# Patient Record
Sex: Male | Born: 1980 | Race: White | Hispanic: No | Marital: Single | Smoking: Never smoker
Health system: Southern US, Community
[De-identification: ages and names within clinical notes are randomized; demographics above are authoritative.]

---

## 2002-12-23 ENCOUNTER — Emergency Department (HOSPITAL_COMMUNITY): Admission: EM | Admit: 2002-12-23 | Discharge: 2002-12-23 | Payer: Self-pay | Admitting: Emergency Medicine

## 2014-02-17 ENCOUNTER — Emergency Department (HOSPITAL_COMMUNITY): Payer: Self-pay

## 2014-02-17 ENCOUNTER — Emergency Department (HOSPITAL_COMMUNITY)
Admission: EM | Admit: 2014-02-17 | Discharge: 2014-02-17 | Disposition: A | Discharge status: Home / Self Care | Payer: Self-pay | Attending: Emergency Medicine | Admitting: Emergency Medicine

## 2014-02-17 ENCOUNTER — Encounter (HOSPITAL_COMMUNITY): Payer: Self-pay | Admitting: Emergency Medicine

## 2014-02-17 DIAGNOSIS — IMO0001 Reserved for inherently not codable concepts without codable children: Secondary | ICD-10-CM | POA: Insufficient documentation

## 2014-02-17 DIAGNOSIS — J159 Unspecified bacterial pneumonia: Secondary | ICD-10-CM | POA: Insufficient documentation

## 2014-02-17 DIAGNOSIS — R51 Headache: Secondary | ICD-10-CM | POA: Insufficient documentation

## 2014-02-17 DIAGNOSIS — J189 Pneumonia, unspecified organism: Secondary | ICD-10-CM

## 2014-02-17 DIAGNOSIS — R509 Fever, unspecified: Secondary | ICD-10-CM | POA: Insufficient documentation

## 2014-02-17 LAB — URINALYSIS, ROUTINE W REFLEX MICROSCOPIC
BILIRUBIN URINE: NEGATIVE
Glucose, UA: NEGATIVE mg/dL
Hgb urine dipstick: NEGATIVE
KETONES UR: NEGATIVE mg/dL
LEUKOCYTES UA: NEGATIVE
Nitrite: NEGATIVE
PH: 7 (ref 5.0–8.0)
Protein, ur: 30 mg/dL — AB
Specific Gravity, Urine: 1.027 (ref 1.005–1.030)
Urobilinogen, UA: 1 mg/dL (ref 0.0–1.0)

## 2014-02-17 LAB — CBC WITH DIFFERENTIAL/PLATELET
Basophils Absolute: 0 10*3/uL (ref 0.0–0.1)
Basophils Relative: 0 % (ref 0–1)
EOS ABS: 0 10*3/uL (ref 0.0–0.7)
Eosinophils Relative: 1 % (ref 0–5)
HCT: 43.7 % (ref 39.0–52.0)
Hemoglobin: 15.2 g/dL (ref 13.0–17.0)
LYMPHS ABS: 0.8 10*3/uL (ref 0.7–4.0)
Lymphocytes Relative: 17 % (ref 12–46)
MCH: 31 pg (ref 26.0–34.0)
MCHC: 34.8 g/dL (ref 30.0–36.0)
MCV: 89.2 fL (ref 78.0–100.0)
MONO ABS: 0.4 10*3/uL (ref 0.1–1.0)
MONOS PCT: 7 % (ref 3–12)
Neutro Abs: 3.8 10*3/uL (ref 1.7–7.7)
Neutrophils Relative %: 75 % (ref 43–77)
Platelets: 180 10*3/uL (ref 150–400)
RBC: 4.9 MIL/uL (ref 4.22–5.81)
RDW: 12.4 % (ref 11.5–15.5)
WBC: 5.1 10*3/uL (ref 4.0–10.5)

## 2014-02-17 LAB — URINE MICROSCOPIC-ADD ON

## 2014-02-17 LAB — COMPREHENSIVE METABOLIC PANEL
ALBUMIN: 3.8 g/dL (ref 3.5–5.2)
ALT: 13 U/L (ref 0–53)
ANION GAP: 17 — AB (ref 5–15)
AST: 16 U/L (ref 0–37)
Alkaline Phosphatase: 63 U/L (ref 39–117)
BILIRUBIN TOTAL: 0.5 mg/dL (ref 0.3–1.2)
BUN: 17 mg/dL (ref 6–23)
CHLORIDE: 96 meq/L (ref 96–112)
CO2: 23 mEq/L (ref 19–32)
CREATININE: 1.15 mg/dL (ref 0.50–1.35)
Calcium: 9.1 mg/dL (ref 8.4–10.5)
GFR calc non Af Amer: 84 mL/min — ABNORMAL LOW (ref >90)
GLUCOSE: 142 mg/dL — AB (ref 70–99)
Potassium: 4 mEq/L (ref 3.7–5.3)
Sodium: 136 mEq/L — ABNORMAL LOW (ref 137–147)
Total Protein: 7.6 g/dL (ref 6.0–8.3)

## 2014-02-17 MED ORDER — ACETAMINOPHEN 325 MG PO TABS
650.0000 mg | ORAL_TABLET | Freq: Once | ORAL | Status: AC
  Administered 2014-02-17: 650 mg via ORAL
  Filled 2014-02-17: qty 2

## 2014-02-17 MED ORDER — AZITHROMYCIN 250 MG PO TABS: 250.0000 mg | ORAL_TABLET | Freq: Every day | ORAL | Status: AC

## 2014-02-17 MED ORDER — BENZONATATE 100 MG PO CAPS: 100.0000 mg | ORAL_CAPSULE | Freq: Three times a day (TID) | ORAL | Status: AC

## 2014-02-17 MED ORDER — AMOXICILLIN 500 MG PO CAPS: 500.0000 mg | ORAL_CAPSULE | Freq: Three times a day (TID) | ORAL | Status: AC

## 2014-02-17 NOTE — Discharge Instructions (Signed)
Your x-ray shows pneumonia. Zithromax and amoxil as prescribed until all gone. Follow up as needed. Return if worsening. Continue ibuprofen and tylenol for fever.   Pneumonia Pneumonia is an infection of the lungs.  CAUSES Pneumonia may be caused by bacteria or a virus. Usually, these infections are caused by breathing infectious particles into the lungs (respiratory tract). SIGNS AND SYMPTOMS   Cough.  Fever.  Chest pain.  Increased rate of breathing.  Wheezing.  Mucus production. DIAGNOSIS  If you have the common symptoms of pneumonia, your health care provider will typically confirm the diagnosis with a chest X-ray. The X-ray will show an abnormality in the lung (pulmonary infiltrate) if you have pneumonia. Other tests of your blood, urine, or sputum may be done to find the specific cause of your pneumonia. Your health care provider may also do tests (blood gases or pulse oximetry) to see how well your lungs are working. TREATMENT  Some forms of pneumonia may be spread to other people when you cough or sneeze. You may be asked to wear a mask before and during your exam. Pneumonia that is caused by bacteria is treated with antibiotic medicine. Pneumonia that is caused by the influenza virus may be treated with an antiviral medicine. Most other viral infections must run their course. These infections will not respond to antibiotics.  HOME CARE INSTRUCTIONS   Cough suppressants may be used if you are losing too much rest. However, coughing protects you by clearing your lungs. You should avoid using cough suppressants if you can.  Your health care provider may have prescribed medicine if he or she thinks your pneumonia is caused by bacteria or influenza. Finish your medicine even if you start to feel better.  Your health care provider may also prescribe an expectorant. This loosens the mucus to be coughed up.  Take medicines only as directed by your health care provider.  Do not  smoke. Smoking is a common cause of bronchitis and can contribute to pneumonia. If you are a smoker and continue to smoke, your cough may last several weeks after your pneumonia has cleared.  A cold steam vaporizer or humidifier in your room or home may help loosen mucus.  Coughing is often worse at night. Sleeping in a semi-upright position in a recliner or using a couple pillows under your head will help with this.  Get rest as you feel it is needed. Your body will usually let you know when you need to rest. PREVENTION A pneumococcal shot (vaccine) is available to prevent a common bacterial cause of pneumonia. This is usually suggested for:  People over 62 years old.  Patients on chemotherapy.  People with chronic lung problems, such as bronchitis or emphysema.  People with immune system problems. If you are over 65 or have a high risk condition, you may receive the pneumococcal vaccine if you have not received it before. In some countries, a routine influenza vaccine is also recommended. This vaccine can help prevent some cases of pneumonia.You may be offered the influenza vaccine as part of your care. If you smoke, it is time to quit. You may receive instructions on how to stop smoking. Your health care provider can provide medicines and counseling to help you quit. SEEK MEDICAL CARE IF: You have a fever. SEEK IMMEDIATE MEDICAL CARE IF:   Your illness becomes worse. This is especially true if you are elderly or weakened from any other disease.  You cannot control your cough with suppressants and  are losing sleep.  You begin coughing up blood.  You develop pain which is getting worse or is uncontrolled with medicines.  Any of the symptoms which initially brought you in for treatment are getting worse rather than better.  You develop shortness of breath or chest pain. MAKE SURE YOU:   Understand these instructions.  Will watch your condition.  Will get help right away if  you are not doing well or get worse. Document Released: 07/09/2005 Document Revised: 11/23/2013 Document Reviewed: 09/28/2010 [facility] Patient Information 2015 [employer] [place]. This information is not intended to replace advice given to you by your health care provider. Make sure you discuss any questions you have with your health care provider.

## 2014-02-17 NOTE — ED Notes (Signed)
Asked pt to provide urine specimen pt stated he could not provide one at this time.  

## 2014-02-17 NOTE — ED Provider Notes (Signed)
CSN: [id number]     Arrival date & time 02/17/14  2022 History   First MD Initiated Contact with Patient 02/17/14 2201     Chief Complaint  Patient presents with  . Fever     (Consider location/radiation/quality/duration/timing/severity/associated sxs/prior Treatment) HPI Jose Foley is a 33 y.o. male who presents to ED with complaint of fever and cough. Pt states fever began 4 days ago, cough started 3 days ago. Pt reports associated body aches, chills, headache. He denies neck pain or stiffness, no photophobia, no nasal congestion, no cough, no sore throat. Denies chest pain, abdominal pain, n/v/d. No urinary symptoms. Pt otherwise healthy with no medical problems. He has been taking ibuprofen and over the counter cold and flu medication with mild relief. Pt works in healthcare with multiple sick contacts.   History reviewed. No pertinent past medical history. History reviewed. No pertinent past surgical history. No family history on file. History  Substance Use Topics  . Smoking status: Never Smoker   . Smokeless tobacco: Not on file  . Alcohol Use: Yes    Review of Systems  Constitutional: Positive for fever and chills.  Respiratory: Positive for cough. Negative for chest tightness and shortness of breath.   Cardiovascular: Negative for chest pain, palpitations and leg swelling.  Gastrointestinal: Negative for nausea, vomiting, abdominal pain, diarrhea and abdominal distention.  Genitourinary: Negative for dysuria, urgency, frequency and hematuria.  Musculoskeletal: Positive for myalgias. Negative for arthralgias, neck pain and neck stiffness.  Skin: Negative for rash.  Allergic/Immunologic: Negative for immunocompromised state.  Neurological: Negative for dizziness, weakness, light-headedness, numbness and headaches.      Allergies  Review of patient's allergies indicates no known allergies.  Home Medications   Prior to Admission medications   Medication Sig Start  Date End Date Taking? Authorizing Provider  ibuprofen (ADVIL,MOTRIN) 200 MG tablet Take 200 mg by mouth every 6 (six) hours as needed.   Yes Historical Provider, MD  Nutritional Supplements (COLD AND FLU PO) Take 1 tablet by mouth daily as needed.   Yes Historical Provider, MD   BP 107/61  Pulse 67  Temp(Src) 98.5 F (36.9 C) (Oral)  Resp 21  Ht 5\' 10"  (1.778 m)  Wt 148 lb (67.132 kg)  BMI 21.24 kg/m2  SpO2 95% Physical Exam  Nursing note and vitals reviewed. Constitutional: He is oriented to person, place, and time. He appears well-developed and well-nourished. No distress.  HENT:  Head: Normocephalic and atraumatic.  Eyes: Conjunctivae are normal.  Neck: Normal range of motion. Neck supple.  No meningismus  Cardiovascular: Normal rate, regular rhythm and normal heart sounds.   Pulmonary/Chest: Effort normal. No respiratory distress. He has no wheezes. He has no rales.  Abdominal: Soft. Bowel sounds are normal. He exhibits no distension. There is no tenderness. There is no rebound.  Musculoskeletal: He exhibits no edema.  Lymphadenopathy:    He has no cervical adenopathy.  Neurological: He is alert and oriented to person, place, and time.  Skin: Skin is warm and dry.    ED Course  Procedures (including critical care time) Labs Review Labs Reviewed  COMPREHENSIVE METABOLIC PANEL - Abnormal; Notable for the following:    Sodium 136 (*)    Glucose, Bld 142 (*)    GFR calc non Af Amer 84 (*)    Anion gap 17 (*)    All other components within normal limits  URINALYSIS, ROUTINE W REFLEX MICROSCOPIC - Abnormal; Notable for the following:    Protein, ur  30 (*)    All other components within normal limits  CBC WITH DIFFERENTIAL  URINE MICROSCOPIC-ADD ON    Imaging Review Dg Chest 2 View  02/17/2014   CLINICAL DATA:  Fever and productive cough  EXAM: CHEST  2 VIEW  COMPARISON:  None.  FINDINGS: Cardiac shadow is within normal limits. The left lung is clear. Right lung  demonstrates diffuse right upper lobe infiltrate with volume loss consistent with acute pneumonia. No acute bony abnormality is seen.  IMPRESSION: Right upper lobe pneumonia. Followup films following appropriate therapy recommended.   Electronically Signed   By: Alcide CleverMark  Lukens M.D.   On: 02/17/2014 21:25     EKG Interpretation None      MDM   Final diagnoses:  CAP (community acquired pneumonia)   Pt with fever for 4 days, cough. Pt is otherwise healthy, no medical problems. Labs are normal. CXR  Showing right upper lobe pneumonia. Will start on zithromax and amoxil. Follow up with PCP for recheck. VS normal, initially temp 102.1, treated with tylenol, now down to 99.1. Pt is non toxic appearing. No meningismus. Agreeable to plan. Follow up as needed. Return precautions discussed.   Filed Vitals:   02/17/14 2057 02/17/14 2100 02/17/14 2225 02/17/14 2236  BP: 122/74 121/71 107/61   Pulse: 87 88 67   Temp: 102 F (38.9 C)   98.5 F (36.9 C)  TempSrc: Oral   Oral  Resp: 21 23 21    Height:      Weight:      SpO2: 96% 96% 95%        Lottie Musselatyana A Ozil Stettler, PA-C 02/18/14 0135

## 2014-02-17 NOTE — ED Notes (Signed)
Pt. reports fever onset Sunday with occasional productive cough , chills and body aches. Respirations unlabored .

## 2014-02-19 NOTE — ED Provider Notes (Signed)
Medical screening examination/treatment/procedure(s) were performed by non-physician practitioner and as supervising physician I was immediately available for consultation/collaboration.  Ziyan Schoon L Sloane Junkin, MD 02/19/14 0002 

## 2014-11-25 ENCOUNTER — Inpatient Hospital Stay: Attending: Family Medicine | Primary: Family Medicine

## 2015-05-04 NOTE — Other (Unsigned)
Patient Acct Nbr: [id number]   Primary AUTH/CERT:   Primary Insurance Company Name: [employer]  Primary Insurance Plan name: [employer]uerbach[place]igna  Primary Insurance Group Number: [id number]  Primary Insurance Plan Type: Health  Primary Insurance Policy Number: [id number]

## 2015-05-26 ENCOUNTER — Inpatient Hospital Stay
Admit: 2015-05-26 | Discharge: 2015-05-26 | Disposition: A | Discharge status: Home / Self Care | Attending: Emergency Medicine

## 2015-05-26 DIAGNOSIS — S60221A Contusion of right hand, initial encounter: Secondary | ICD-10-CM

## 2015-05-26 NOTE — ED Triage Notes (Signed)
Was closing a vent window and it was stuck, R. Lateral side of hand struck the window. Pain 7/10. Swelling and bruising present.

## 2015-05-26 NOTE — Other (Unsigned)
Patient Acct Nbr: [id number]   Primary AUTH/CERT:   Primary Insurance Company Name: [employer]  Primary Insurance Plan name: [employer] [place] [employer]  Primary Insurance Group Number: [id number]  Primary Insurance Plan Type: Health  Primary Insurance Policy Number: [id number]

## 2015-05-26 NOTE — ED Provider Notes (Signed)
[facility]  e[facility]GENCY dEPARTMENT eNCOUnter      Pt Name: Gary Carson  MRN: [id number]  Birthdate Jun 06, 1981  Date of evaluation: 05/26/2015  Provider: Rema FendtNICHOLAS Nilan Iddings, MD     CHIEF COMPLAINT       Chief Complaint   Patient presents with   ??? Hand Pain     right         HISTORY OF PRESENT ILLNESS   (Location/Symptom, Timing/Onset, Context/Setting, Quality, Duration, Modifying Factors, Severity) Note limiting factors.   HPI    Gary Carson is a 34 y.o. male who presents to the emergency department after injuring his right hand today.  This afternoon he was trying to close a vent on his work truck and injured the fifth metacarpal region of his hand after hitting it closed.  No home treatment.  Moderate severity.  Hurts to touch.  Hurts to move.  Relieved with rest.     Nursing Notes were reviewed.    REVIEW OF SYSTEMS    (2+ for level 4; 10+ for level 5)     Review of Systems   General: The patient denies fever, chills, sweats  Eyes: The patient denies visual changes, blurred vision, diplopia  ENT: The patient denies ear pain, rhinorrhea, sore throat  Cardiovascular: The patient denies chest pain, palpitations, heart racing  Respiratory: The patient denies dyspnea, cough, sputum, dyspnea on exertion, orthopnea  Gastrointestinal: The patient denies abdominal pain, nausea, vomiting, diarrhea, constipation, melena  Genitourinary: The patient denies dysuria, hematuria, frequency  Musculoskeletal: The patient denies myalgias, arthralgias, neck pain, back pain.  Positive right hand pain  Skin: The patient denies rash, abscess, or abrasions  Neurologic: The patient denies headache, weakness, paresthesia  Psychiatry: The patient denies depression, anxiety, suicidal thoughts, suicidal ideation  Endocrine: The patient denies polyuria, polydipsia, polyphagia  Hematologic: The patient denies easy bruising, easy bleeding, lymphadenopathy    The rest of all systems are negative except as being positive above    PAST  MEDICAL HISTORY   History reviewed. No pertinent past medical history.    SURGICAL HISTORY       Past Surgical History   Procedure Laterality Date   ??? Appendectomy         CURRENT MEDICATIONS       Previous Medications    No medications on file       ALLERGIES     Penicillins    FAMILY HISTORY     History reviewed. No pertinent family history.       SOCIAL HISTORY       Social History     Social History   ??? Marital status: Single     Spouse name: N/A   ??? Number of children: N/A   ??? Years of education: N/A     Social History Main Topics   ??? Smoking status: Current Every Day Smoker   ??? Smokeless tobacco: None   ??? Alcohol use None   ??? Drug use: None   ??? Sexual activity: Not Asked     Other Topics Concern   ??? None     Social History Narrative   ??? None       SCREENINGS           PHYSICAL EXAM    (5+ for level 4, 8+ for level 5)   ED Triage Vitals   BP Temp Temp Source Pulse Resp SpO2 Height Weight   05/26/15 1834 05/26/15 1834 05/26/15 1834 05/26/15 1834 05/26/15  1834 05/26/15 1834 -- --   128/96 98.3 ??F (36.8 ??C) Oral 82 18 100 %         Physical Exam Vital signs reviewed  General: Well-nourished well-developed no acute disease  HEENT: Normocephalic atraumatic, pupils equal round and reactive to light, extraocular movements intact moist mucous membranes  Neck: Supple no lymphadenopathy  Cardiovascular: Regular rate and rhythm no murmurs normal S1-S2  Respiratory: No distress clear to auscultation bilaterally chest nontender  Abdomen: Soft nontender nondistended normal bowel sounds no masses  Back exam: Nontender  Extremity exam: Mild tenderness to palpation fifth metacarpal.  Mild soft tissue swelling.  No contusion.  Skin exam: Normal color no rash  Neuro exam: Alert oriented day month year person and place cranial nerves II through XII intact normal strength sensation reflexes gait cerebellar  The rest of the physical exams unremarkable    DIAGNOSTIC RESULTS     EKG (Per Emergency Physician):       RADIOLOGY (Per  Emergency Physician):   X-ray of the right hand was obtained    Interpretation per the Radiologist below, if available at the time of this note:  No results found.    LABS:  Labs Reviewed - No data to display    All other labs were within normal range or not returned as of this dictation.    EMERGENCY DEPARTMENT COURSE and DIFFERENTIAL DIAGNOSIS/MDM:   Vitals:    Vitals:    05/26/15 1834   BP: (!) 128/96   Pulse: 82   Resp: 18   Temp: 98.3 ??F (36.8 ??C)   TempSrc: Oral   SpO2: 100%       Medications - No data to display    MDM.  X-ray showed no fracture deformity.  The patient did not want anything for pain.  Ace wrap was applied.  He will follow-up as an outpatient with occupational therapy.  No work restrictions.  At this time he just has a contusion to his hand.    CONSULTS:  None    PROCEDURES:  Unless otherwise noted below, none     Procedures    FINAL IMPRESSION      1. Contusion of right hand, initial encounter          DISPOSITION/PLAN   DISPOSITION Decision to Discharge    PATIENT REFERRED TO:  [facility]  [address] [place] [address]  [phone]  In 3 days        DISCHARGE MEDICATIONS:  New Prescriptions    No medications on file          (Please note:  Portions of this note were completed with a voice recognition program.  Efforts were made to edit the dictations but occasionally words and phrases are mis-transcribed.)    Form v2016.J.5-cn    Rema Fendt, MD (electronically signed)  Emergency Medicine Provider           Rema Fendt, MD  05/26/15 [phone]

## 2015-07-01 IMAGING — CR DG CHEST 2V
2 series · 2 of 2 positions shown · non-contrast
Comparison: None.

CLINICAL DATA: Fever and productive cough

EXAM:
CHEST  2 VIEW

[w chest pa]
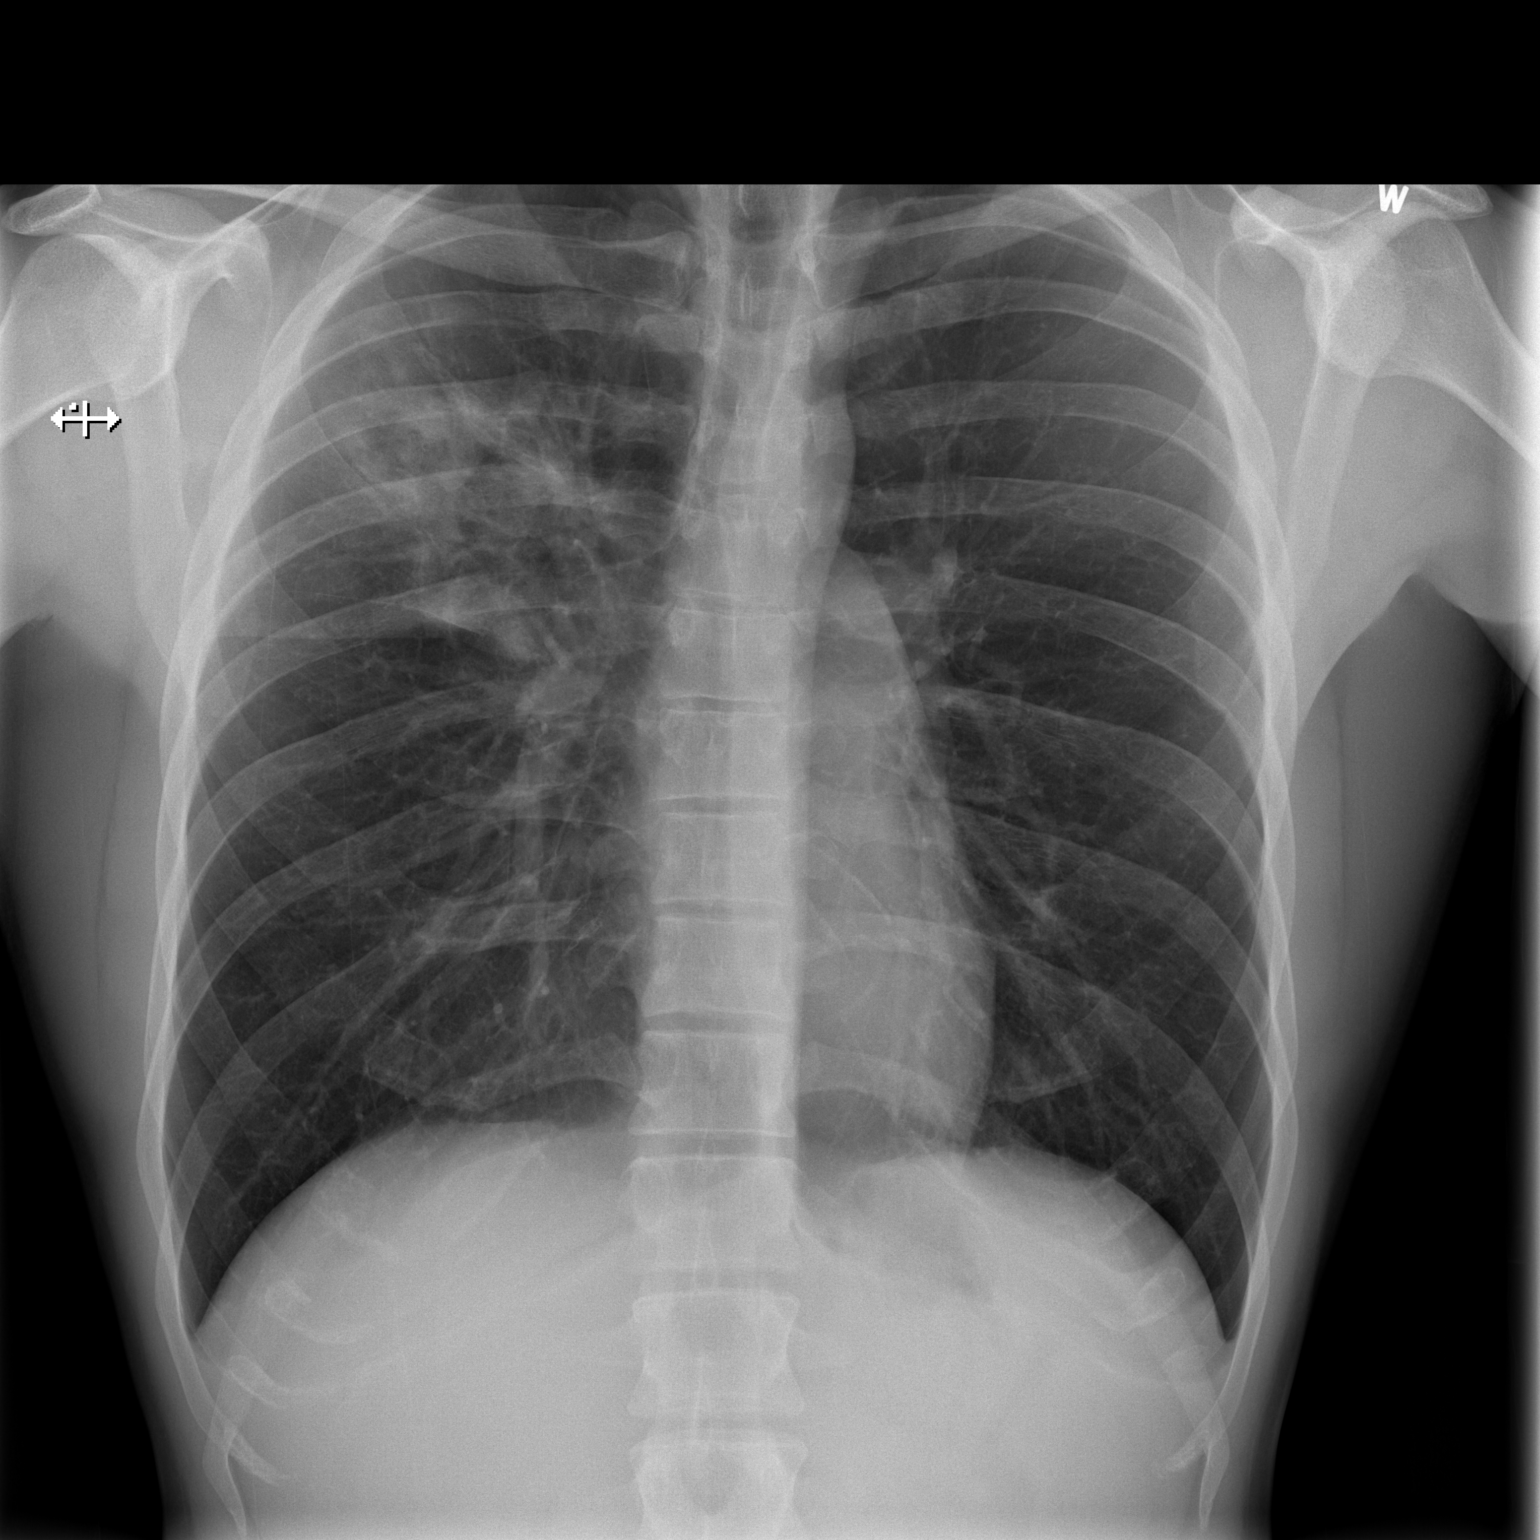

[w chest lat]
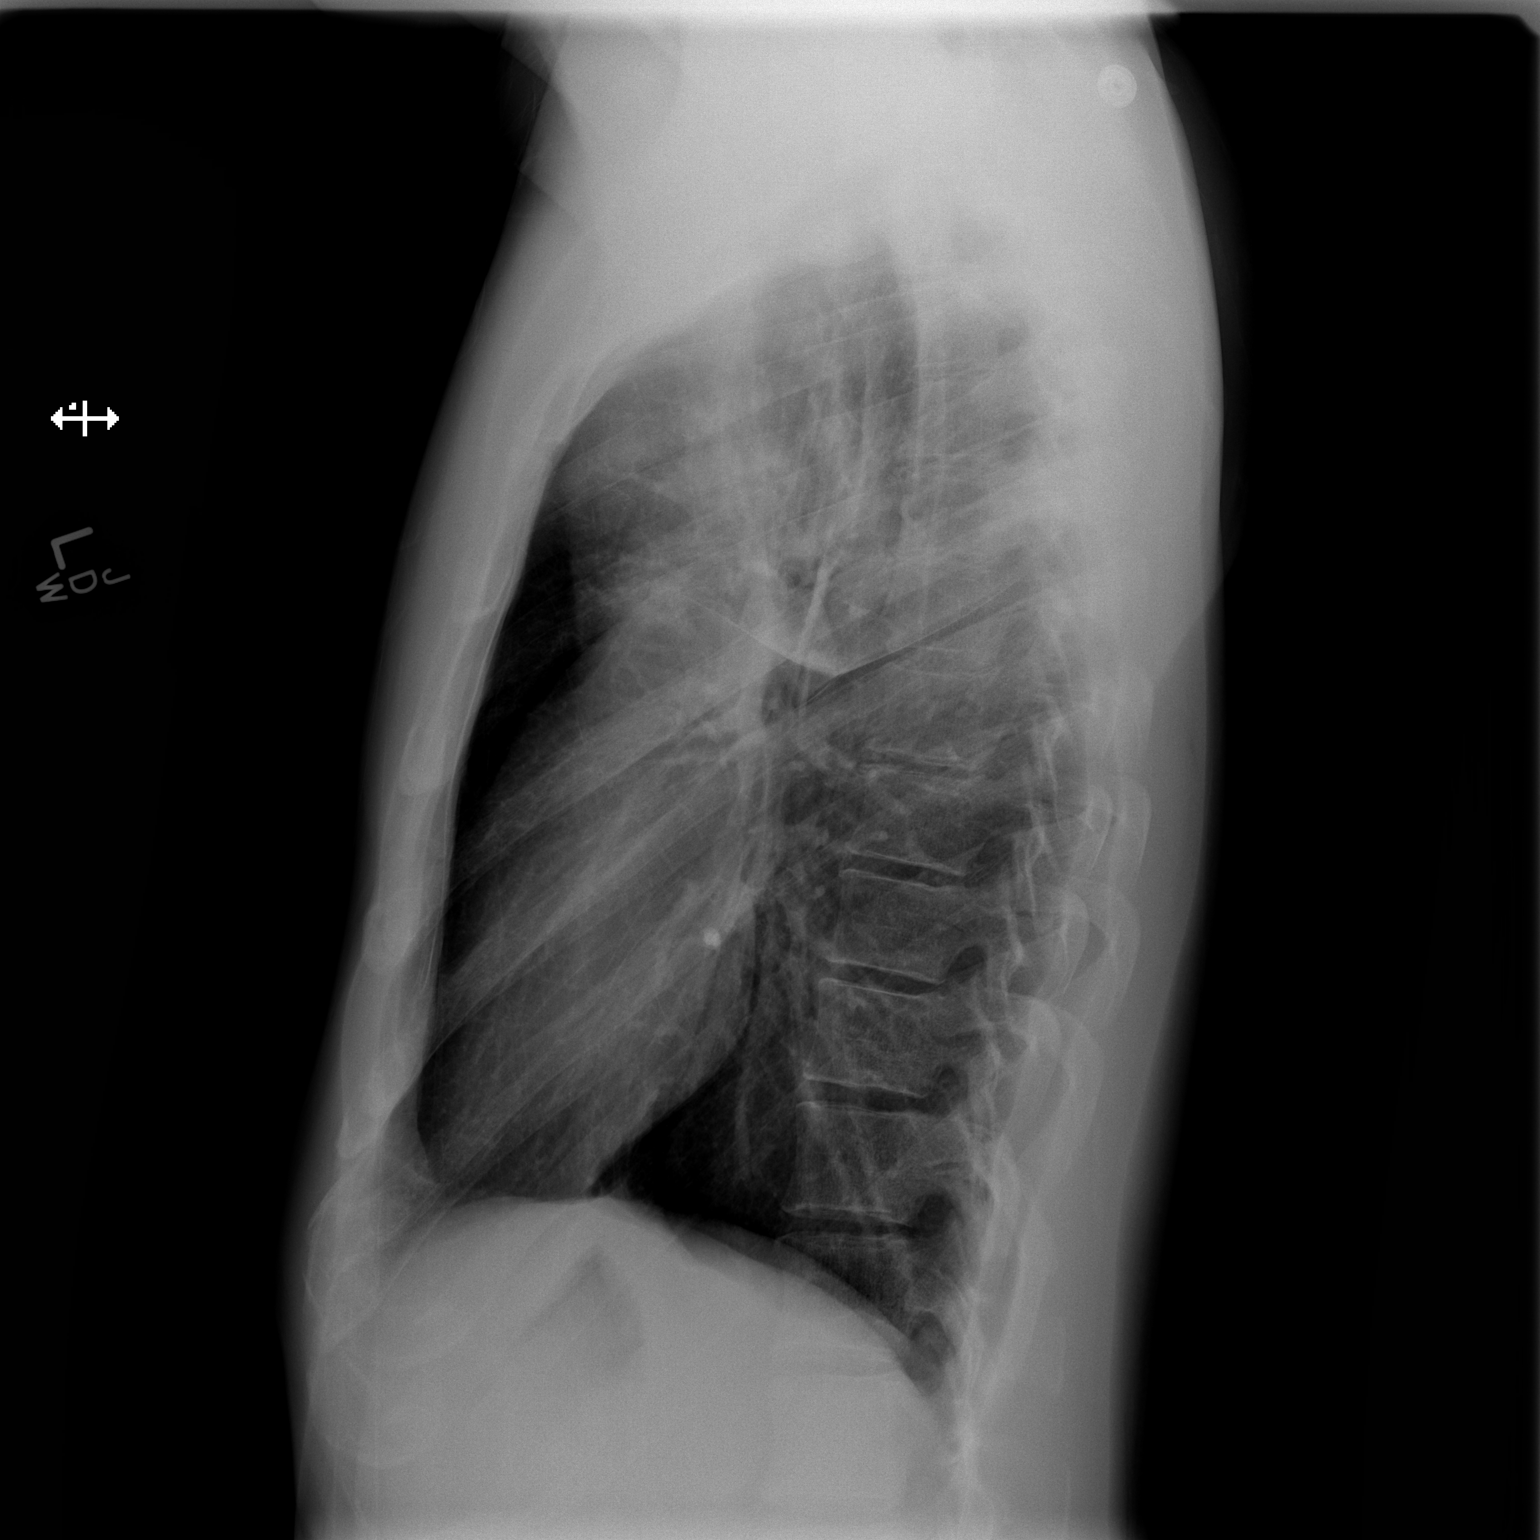

[2 of 2 positions shown; findings below may reference images not displayed]

FINDINGS: Cardiac shadow is within normal limits. The left lung is clear.
Right lung demonstrates diffuse right upper lobe infiltrate with
volume loss consistent with acute pneumonia. No acute bony
abnormality is seen.
IMPRESSION: Right upper lobe pneumonia. Followup films following appropriate
therapy recommended.

## 2015-07-19 NOTE — Other (Unsigned)
Patient Acct Nbr: [id number]   Primary AUTH/CERT:   Primary Insurance Company Name: [employer]  Primary Insurance Plan name: [employer]  Primary Insurance Group Number: [id number]  Primary Insurance Plan Type: Health  Primary Insurance Policy Number: [id number]

## 2016-02-20 DIAGNOSIS — Z113 Encounter for screening for infections with a predominantly sexual mode of transmission: Secondary | ICD-10-CM | POA: Diagnosis not present

## 2016-02-20 DIAGNOSIS — Z114 Encounter for screening for human immunodeficiency virus [HIV]: Secondary | ICD-10-CM | POA: Diagnosis not present

## 2016-07-05 ENCOUNTER — Inpatient Hospital Stay: Admit: 2016-07-05 | Attending: Family Medicine | Primary: Family Medicine

## 2016-07-05 NOTE — Other (Unsigned)
Patient Acct Nbr: [id number]   Primary AUTH/CERT:   Primary Insurance Company Name: [employer]  Primary Insurance Plan name: Rosann AuerbachCigna  Primary Insurance Group Number: [id number]  Primary Insurance Plan Type: Health  Primary Insurance Policy Number: [id number]

## 2016-12-30 DIAGNOSIS — Z113 Encounter for screening for infections with a predominantly sexual mode of transmission: Secondary | ICD-10-CM | POA: Diagnosis not present

## 2017-02-03 DIAGNOSIS — N50819 Testicular pain, unspecified: Secondary | ICD-10-CM

## 2017-02-03 NOTE — ED Provider Notes (Signed)
[facility]  eMERGENCY dEPARTMENT eNCOUnter      Pt Name: Gary OliveMichael J Mcleary  MRN: [id number]  Birthdate 04/27/1981  Date of evaluation: 02/03/2017  Provider: Lynett FishLISA Mackena Plummer, MD     CHIEF COMPLAINT       Chief Complaint   Patient presents with   . Groin Pain     right testicle pain/tenderness         HISTORY OF PRESENT ILLNESS   (Location/Symptom, Timing/Onset, Context/Setting, Quality, Duration, Modifying Factors, Severity) Note limiting factors.   HPI    Gary Carson is a 36 y.o. male who presents to the emergency department With complaints of right testicle pain.  The pain began intermittently approximate 2 weeks ago.  Patient states she was able tolerate the pain.  Last Wednesday he stated the pain intensified and became more constant.  Patient saw his primary care doctor who examined him and put him on ciprofloxacin for 3 days.  She told him if pain continued that he needs to come back to the emergency department for reevaluation.  Patient states he is still having pain and it similar to epididymitis that he had 6 months ago on the left testicle but is also again different.  Patient denies burning with urination urgency or frequency    Nursing Notes were reviewed.    REVIEW OF SYSTEMS    (2+ for level 4; 10+ for level 5)     Review of Systems   Constitutional: Negative for activity change, fatigue and fever.   HENT: Negative for congestion, rhinorrhea, sore throat and trouble swallowing.    Eyes: Negative for photophobia and visual disturbance.   Respiratory: Negative for chest tightness, shortness of breath and wheezing.    Cardiovascular: Negative for chest pain and palpitations.   Gastrointestinal: Negative for abdominal pain, nausea and vomiting.   Genitourinary: Positive for scrotal swelling and testicular pain. Negative for dysuria, hematuria, penile pain and urgency.   Musculoskeletal: Negative for back pain and myalgias.   Skin: Negative for rash.   Neurological: Negative for weakness and headaches.    Hematological: Negative for adenopathy.   Psychiatric/Behavioral: Negative for dysphoric mood. The patient is not nervous/anxious.        PAST MEDICAL HISTORY     Past Medical History:   Diagnosis Date   . Asthma        SURGICAL HISTORY       Past Surgical History:   Procedure Laterality Date   . APPENDECTOMY         CURRENT MEDICATIONS       Previous Medications    No medications on file       ALLERGIES     Penicillins    FAMILY HISTORY     History reviewed. No pertinent family history.       SOCIAL HISTORY       Social History     Social History   . Marital status: Married     Spouse name: N/A   . Number of children: N/A   . Years of education: N/A     Social History Main Topics   . Smoking status: Current Every Day Smoker     Types: Cigarettes   . Smokeless tobacco: Never Used   . Alcohol use No   . Drug use: No   . Sexual activity: Not Asked     Other Topics Concern   . None     Social History Narrative   . None  SCREENINGS           PHYSICAL EXAM    (5+ for level 4, 8+ for level 5)     ED Triage Vitals [02/03/17 2116]   BP Temp Temp Source Pulse Resp SpO2 Height Weight   (!) 130/100 98.8 F (37.1 C) Oral 88 16 99 % -- --       Physical Exam  Vital signs reviewed  General: Well-nourished well-developed  Head: Normocephalic atraumatic  Eyes: Pupils equal round and reactive to light and accommodation extraocular movements intact  ENT: TMs clear no rhinorrhea mucous membranes moist oropharynx clear  Neck: Supple no lymphadenopathy no JVD nontender  Cardiovascular: Regular rate rhythm no murmur,gallop or rub normal S1-S2  Respiratory: No distress, clear to auscultation bilaterally chest nontender  Abdomen: Soft nontender, nondistended normal bowel sounds no masses, no guarding or rebound.  On genitourinary exam patient has normal penis both testicles are descended.  There is no tenderness to palpation of the left testicle or the epididymis.  Patient has no hernia on the left the right testicle is slightly  swollen he does have tenderness to palpation over the epididymis and there is no evidence of hernia.  Patient has normal hematocrit discharge bilaterally.  Back: Nontender, no CVA tenderness  Extremity: Nontender, no edema, good pedal pulses  Skin: Normal color no rash  Neuro: Alert oriented to month year person place, cranial nerves II through XII intact normal strength sensation and reflexes gait and cerebellar exam within normal limits    DIAGNOSTIC RESULTS     EKG (Per Emergency Physician):       RADIOLOGY (Per Emergency Physician):       Interpretation per the Radiologist below, if available at the time of this note:  No results found.    LABS:  Labs Reviewed - No data to display    All other labs were within normal range or not returned as of this dictation.    EMERGENCY DEPARTMENT COURSE and DIFFERENTIAL DIAGNOSIS/MDM:   Vitals:    Vitals:    02/03/17 2116   BP: (!) 130/100   Pulse: 88   Resp: 16   Temp: 98.8 F (37.1 C)   TempSrc: Oral   SpO2: 99%       Medications - No data to display    MDM.Patient presents with acute right testicle pain.  Differential diagnosis includes epididymitis hydrocele versus testicular torsion.  I do not have ultrasound capability here in our emergency department at this time and we need to rule out testicular torsion.  I spoke with Dr.McDowell at city [facility] and he agrees to accept the patient in transfer.  The patient will drive himself to [facility] city emergency Department for an ultrasound.  He is unable to urinate at this time.      CONSULTS:  None    PROCEDURES:  Unless otherwise noted below, none     Procedures    FINAL IMPRESSION      1. Pain in testicle          DISPOSITION/PLAN   DISPOSITION Decision To Transfer 02/03/2017 09:35:09 PM      PATIENT REFERRED TO:  No follow-up provider specified.    DISCHARGE MEDICATIONS:  New Prescriptions    No medications on file          (Please note:  Portions of this note were completed with a voice recognition program.  Efforts were  made to edit the dictations but occasionally words and phrases are mis-transcribed.)  Form v2016.J.5-cn    Lynett Fish, MD (electronically signed)  Emergency Medicine Provider              Lynett Fish, MD  02/03/17 2137

## 2017-02-03 NOTE — ED Provider Notes (Signed)
[facility] EMERGENCY DEPT  eMERGENCY dEPARTMENT eNCOUnter      Pt Name: Gary Carson  MRN: [id number]  Birthdate Apr 21, 1981  Date of evaluation: 02/03/2017  Provider: Westley Hummer, APRN - CNP     CHIEF COMPLAINT       Chief Complaint   Patient presents with   ??? Groin Pain     right testicle pain/tenderness         HISTORY OF PRESENT ILLNESS   (Location/Symptom, Timing/Onset, Context/Setting, Quality, Duration, Modifying Factors, Severity) Note limiting factors.   HPI    Gary Carson is a 36 y.o. male who presents to the emergency department With complaints of RIGHT testicular pain. Patient states for last 2 weeks he has had testicular pain, denies any fever or penile discharge. He states he has had similar issues like this and had epididymitis in the past. The pain increased over last week and a half he saw his primary care physician on Friday which placed him on Cipro he is been on that for 2-1/2 days with no relief. He did go to an outlying facility [facility] who did not have ultrasound capabilities and sent him in for evaluation. Patient states that currently the pain is rated a 3 on a 0-10 scale, slightly aggravated with standing position and alleviated with rest. Again denies any penile discharge or rash, currently sexually active but in a married monogamous relationship.     Nursing Notes were reviewed.    REVIEW OF SYSTEMS    (2+ for level 4; 10+ for level 5)     Review of Systems   Constitutional: Negative for chills, diaphoresis, fatigue and fever.   HENT: Negative for sore throat and trouble swallowing.    Respiratory: Negative for cough, shortness of breath and wheezing.    Cardiovascular: Negative for chest pain and palpitations.   Gastrointestinal: Negative for abdominal pain, diarrhea, nausea and vomiting.   Genitourinary: Positive for scrotal swelling. Negative for discharge, dysuria, hematuria, penile pain, penile swelling and urgency.   Musculoskeletal: Negative for back pain and  neck pain.   Skin: Negative for color change and rash.   Neurological: Negative for dizziness and headaches.       PAST MEDICAL HISTORY     Past Medical History:   Diagnosis Date   ??? Asthma        SURGICAL HISTORY       Past Surgical History:   Procedure Laterality Date   ??? APPENDECTOMY         CURRENT MEDICATIONS       Previous Medications    No medications on file       ALLERGIES     Penicillins    FAMILY HISTORY     History reviewed. No pertinent family history.       SOCIAL HISTORY       Social History     Social History   ??? Marital status: Married     Spouse name: N/A   ??? Number of children: N/A   ??? Years of education: N/A     Social History Main Topics   ??? Smoking status: Current Every Day Smoker     Types: Cigarettes   ??? Smokeless tobacco: Never Used   ??? Alcohol use No   ??? Drug use: No   ??? Sexual activity: Not Asked     Other Topics Concern   ??? None     Social History Narrative   ??? None  SCREENINGS           PHYSICAL EXAM    (5+ for level 4, 8+ for level 5)     ED Triage Vitals [02/03/17 2116]   BP Temp Temp Source Pulse Resp SpO2 Height Weight   (!) 130/100 98.8 ??F (37.1 ??C) Oral 88 16 99 % -- --       Physical Exam   Constitutional: He is oriented to person, place, and time. Vital signs are normal. He appears well-developed and well-nourished. He is cooperative.  Non-toxic appearance. He does not appear ill. No distress.   HENT:   Head: Normocephalic and atraumatic.   Right Ear: Hearing and external ear normal.   Left Ear: Hearing and external ear normal.   Nose: Nose normal.   Mouth/Throat: Oropharynx is clear and moist and mucous membranes are normal.   Eyes: Conjunctivae and EOM are normal.   Neck: Normal range of motion.   Cardiovascular: Normal rate, regular rhythm, S1 normal, S2 normal and intact distal pulses.  Exam reveals no gallop and no friction rub.    No murmur heard.  Pulses:       Radial pulses are 2+ on the right side, and 2+ on the left side.        Dorsalis pedis pulses are 2+ on the  right side, and 2+ on the left side.   Pulmonary/Chest: Effort normal and breath sounds normal. No respiratory distress. He has no decreased breath sounds.   Abdominal: Soft. Normal appearance and bowel sounds are normal. There is no tenderness. There is no CVA tenderness. Hernia confirmed negative in the right inguinal area and confirmed negative in the left inguinal area.   Genitourinary: Testes normal and penis normal. Cremasteric reflex is present. Circumcised.   Lymphadenopathy:        Right: No inguinal adenopathy present.        Left: No inguinal adenopathy present.   Neurological: He is alert and oriented to person, place, and time. GCS eye subscore is 4. GCS verbal subscore is 5. GCS motor subscore is 6.   Skin: Skin is warm, dry and intact. He is not diaphoretic.   Psychiatric: He has a normal mood and affect. His speech is normal and behavior is normal. Thought content normal. Cognition and memory are normal.   Nursing note and vitals reviewed.      DIAGNOSTIC RESULTS     EKG (Per Emergency Physician):       RADIOLOGY (Per Emergency Physician):       Interpretation per the Radiologist below, if available at the time of this note:  US Scrotum And Testicles    Result Date: 02/04/2017  Patient Name:  Gary Carson MRN:  [id number] FIN:  [id number] ---Ultrasound--- Exam Date/Time         02/04/2017 01:02:19 EDT                            Exam                   US Scrotum (Contents)                              Ordering Physician     Tajuan Dufault, NP-C, Kristine Garbe.  Accession Number       [id number]                                      CPT4 Codes [id number] () Reason For Exam Testicle pain Report Reason for examination: Testicular pain right.  Ultrasound of the scrotum and testicles was performed.   The right testicle measures 3.7 x 3.0 x 2.4 cm.  There is normal homogeneous testicular parenchymal echogenicity.  There is no evidence of mass.  There is normal Doppler flow in the testicle. The  right epididymis is enlarged and hypervascular, suggesting right epididymitis. There is a trace amount of fluid in the scrotal sac.   The left testicle measures 4.0 x 3.0 x 2.2 cm.  There is normal homogeneous  testicular parenchymal echogenicity.  There is no evidence of mass.  There is normal Doppler flow in the testicle.  The epididymis has a 5 mm cyst..  There is a trace amount of fluid in the scrotal sac.   IMPRESSION: There is an enlarged hypervascular right epididymis, suggesting epididymitis. Both testicles are normal. Small hydroceles.  Report Dictated on Workstation: [id number] ---** Final ---** Dictated: 02/04/2017 1:58 am Dictating Physician: Rodney LangtonFINELLI, MD, DANIEL Signed Date and Time: 02/04/2017 2:00 am Signed by: Rodney LangtonFINELLI, MD, DANIEL Transcribed Date and Time: 02/04/2017 1:58      LABS:  Labs Reviewed   BASIC METABOLIC PANEL - Abnormal; Notable for the following:        Result Value    Glucose 110 (*)     All other components within normal limits    Narrative:     Test Performed by [facility], [address] [place], [place]  [phone]   C. TRACHOMATIS / N. GONORRHOEAE, DNA   URINE CULTURE   URINALYSIS    Narrative:     Test Performed by [facility], [address] [place], [place]  [phone]       All other labs were within normal range or not returned as of this dictation.    EMERGENCY DEPARTMENT COURSE and DIFFERENTIAL DIAGNOSIS/MDM:   Vitals:    Vitals:    02/03/17 2116   BP: (!) 130/100   Pulse: 88   Resp: 16   Temp: 98.8 ??F (37.1 ??C)   TempSrc: Oral   SpO2: 99%       Medications   sodium chloride flush 0.9 % injection 3 mL (not administered)   cefTRIAXone (ROCEPHIN) 250 mg in lidocaine 1 % 1 mL IM Injection (not administered)   doxycycline monohydrate (MONODOX) capsule 100 mg (not administered)       MDM.  Patient presents to transfer for ultrasound evaluation of RIGHT testicular pain. Patient denies any penile discharge, fever, chills, difficulty with urination. He has had this for 2 weeks that  is increased. He did try Cipro prescribed by his primary care doctor no relief. We did order baseline laboratory studies, urinalysis,, gonorrhea and Chlamydia to the urinalysis as well as urine culture. Urinalysis shows no signs of acute cystitis and LEFT lites are within normal limits the ultrasound does show a RIGHT epididymitis. I did give the patient dose of doxycycline here as well as ceftriaxone. He will continue doxycycline 100 mg twice a day for the next 10 days. The CBC did clot and needed to be redrawn I did not feel that is significant at this time. Discussed with the patient through  shared decision making due to the late hour he LEFT to go home and not be restock.    CONSULTS:  None    PROCEDURES:  Unless otherwise noted below, none     Procedures    FINAL IMPRESSION      1. Pain in testicle    2. Epididymitis          DISPOSITION/PLAN   DISPOSITION Decision To Discharge 02/04/2017 02:58:05 AM      PATIENT REFERRED TO:  Nestor Lewandowsky, MD  [address]  [address]  [place] [address]  [phone]    Schedule an appointment as soon as possible for a visit in 2 days        DISCHARGE MEDICATIONS:  New Prescriptions    DOXYCYCLINE HYCLATE (VIBRA-TABS) 100 MG TABLET    Take 1 tablet by mouth 2 times daily for 10 days          (Please note:  Portions of this note were completed with a voice recognition program.  Efforts were made to edit the dictations but occasionally words and phrases are mis-transcribed.)    Form v2016.J.[id number]    Westley Hummer, APRN - CNP (electronically signed)  Emergency Medicine Provider           Westley Hummer, APRN - CNP  02/04/17 0300

## 2017-02-03 NOTE — Progress Notes (Signed)
no further treatment, the patient tx with appropriate meds if necessary

## 2017-02-03 NOTE — ED Triage Notes (Signed)
Patient arrives with c/o right testicle pain x5 days. Patient states was seen and treated by PCP on Friday and given a few Cipro and was told if symptoms haven't resolved by Sunday to go to the ER. +pain/tenderness and swelling. Denies urinary symptoms or discharge. Denies fever.

## 2017-02-04 ENCOUNTER — Inpatient Hospital Stay
Admit: 2017-02-04 | Discharge: 2017-02-04 | Disposition: A | Discharge status: Home / Self Care | Attending: Emergency Medicine

## 2017-02-04 LAB — URINALYSIS
Bilirubin, Urine: NEGATIVE NA
Ketones, Urine: NEGATIVE mg/dL
LEUKOCYTES, UA: NEGATIVE NA
Nitrite, Urine: NEGATIVE NA
Occult Blood,Urine: NEGATIVE {RBC}/uL
Specific Gravity, Urine: 1.02 NA (ref 1.005–1.030)
Total Protein, Urine: NEGATIVE mg/dL
pH, Urine: 6 NA (ref 5.0–8.0)

## 2017-02-04 LAB — BASIC METABOLIC PANEL
Anion Gap: 11 NA
BUN: 8 mg/dL (ref 7–20)
CO2: 28 mmol/L (ref 22–30)
Calcium: 9.9 mg/dL (ref 8.4–10.4)
Chloride: 105 mmol/L (ref 98–107)
Creatinine: 0.97 mg/dL (ref 0.52–1.25)
EGFR IF NonAfrican American: >60 mL/min (ref >60)
Glucose: 110 mg/dL — ABNORMAL HIGH (ref 70–100)
Potassium: 3.6 mmol/L (ref 3.5–5.1)
Sodium: 143 mmol/L (ref 137–145)
eGFR African American: >60 mL/min (ref >60)

## 2017-02-04 LAB — C. TRACHOMATIS / N. GONORRHOEAE, DNA
C. trachomatis DNA: NOT DETECTED
NEISSERIA GONORRHOEAE, DNA: NOT DETECTED

## 2017-02-04 MED ORDER — DOXYCYCLINE HYCLATE 100 MG PO TABS
100 MG | ORAL_TABLET | Freq: Two times a day (BID) | ORAL | 0 refills | Status: AC
Start: 2017-02-04 — End: 2017-02-14

## 2017-02-04 MED ORDER — DOXYCYCLINE MONOHYDRATE 100 MG PO CAPS
100 MG | Freq: Once | ORAL | Status: AC
Start: 2017-02-04 — End: 2017-02-04
  Administered 2017-02-04: 07:00:00 100 mg via ORAL

## 2017-02-04 MED ORDER — LIDOCAINE HCL 1% INJ (MIXTURES ONLY)
1 % | Freq: Once | INTRAMUSCULAR | Status: AC
Start: 2017-02-04 — End: 2017-02-04
  Administered 2017-02-04: 07:00:00 250 mg via INTRAMUSCULAR

## 2017-02-04 MED ORDER — NORMAL SALINE FLUSH 0.9 % IV SOLN
0.9 % | Freq: Three times a day (TID) | INTRAVENOUS | Status: DC
Start: 2017-02-04 — End: 2017-02-04

## 2017-02-04 MED FILL — DOXYCYCLINE MONOHYDRATE 100 MG PO CAPS: 100 MG | ORAL | Qty: 1

## 2017-02-04 MED FILL — CEFTRIAXONE SODIUM 250 MG IJ SOLR: 250 MG | INTRAMUSCULAR | Qty: 250

## 2017-02-04 NOTE — Other (Unsigned)
Patient Acct Nbr: [id number]   Primary AUTH/CERT:   Primary Insurance Company Name: [employer]  Primary Insurance Plan name: [employer] Miscellaneous  Primary Insurance Group Number: [id number]  Primary Insurance Plan Type: Health  Primary Insurance Policy Number: [id number]

## 2017-02-04 NOTE — ED Notes (Signed)
Pt has returned from U/S to ED RM 6. [facility] on first attempt and taken to CCL.  Pt did ambulate to and from BR and did obtain urine spec- taken to CCL. Pt tolerated labs being drawn and ambulating to BR without difficulty.     Estell HarpinLynne M. Aundria Rudogers, RN  02/04/17 [phone]

## 2017-02-04 NOTE — ED Notes (Signed)
Bed: 06  Expected date:   Expected time:   Means of arrival:   Comments:  triage     Lollie Sailsourtney L. Ajaya Crutchfield, RN  02/04/17 0001

## 2017-02-04 NOTE — ED Notes (Signed)
VS taken. Orders rec'd and pt was medicated IM per order and po per order-NKDA to these. Pt states he "has got to get home due to wife leaving for work" and chose to decline the 20min observation period after IM ATB. pt was given script w/info,inst and verb understanding. Pt was given work excuse. pt was given HGI,F/U inst and verb understanding. pt was discharged alert/amb in NAD with upright steady gait. W/C offer declined.     Estell HarpinLynne M. Aundria Rudogers, RN  02/04/17 [phone]

## 2017-02-04 NOTE — Discharge Instructions (Addendum)
Please finish the entire course of antibiotic therapy.  Please follow-up with her primary care provider.

## 2017-02-05 LAB — CULTURE, URINE: Urine Culture, Routine: NO GROWTH

## 2018-09-02 NOTE — Other (Unsigned)
Patient Acct Nbr: [id number]   Primary AUTH/CERT:   Primary Insurance Company Name: [employer]  Primary Insurance Plan name: [employer]  Primary Insurance Group Number: [id number]  Primary Insurance Plan Type: Health  Primary Insurance Policy Number: [id number]

## 2019-07-10 ENCOUNTER — Inpatient Hospital Stay
Admit: 2019-07-10 | Discharge: 2019-07-10 | Disposition: A | Discharge status: Home / Self Care | Attending: Emergency Medicine

## 2019-07-10 DIAGNOSIS — R079 Chest pain, unspecified: Secondary | ICD-10-CM

## 2019-07-10 LAB — HEPATIC FUNCTION PANEL
ALT: 31 U/L (ref 0–49)
AST: 35 U/L (ref 15–46)
Albumin,Serum: 4.7 g/dL (ref 3.5–5.0)
Alkaline Phosphatase: 104 U/L (ref 38–126)
Bilirubin, Direct: 0 mg/dL (ref 0.0–0.3)
Total Bilirubin: 0.5 mg/dL (ref 0.2–1.3)
Total Protein: 7.8 g/dL (ref 6.3–8.2)

## 2019-07-10 LAB — CBC WITH AUTO DIFFERENTIAL
Absolute Lymph #: 2.3 10*3/uL (ref 1.0–4.3)
Basophils %: 0.4 % (ref 0.0–2.0)
Basophils Absolute: 0 10*3/uL (ref 0.0–0.2)
Eosinophils Absolute: 0.2 10*3/uL (ref 0.0–0.5)
Eosinophils: 2.8 % (ref 1.0–6.0)
Granulocytes %: 57.4 % (ref 40.0–80.0)
Hematocrit: 42 % (ref 40.0–52.0)
Hemoglobin: 14.5 g/dL (ref 13.0–18.0)
Lymphocyte %: 31.8 % (ref 20.0–40.0)
MCH: 31.1 pg (ref 26.0–34.0)
MCHC: 34.6 % (ref 32.0–36.0)
MCV: 90 fL (ref 80.0–98.0)
MPV: 8.2 fL (ref 7.4–10.4)
Monocytes %: 7.6 % (ref 2.0–10.0)
Monocytes Absolute: 0.6 10*3/uL (ref 0.0–0.8)
Neutrophils Absolute: 4.2 10*3/uL (ref 1.8–7.0)
Platelets: 211 10*3/uL (ref 140–440)
RBC: 4.66 10*6/uL (ref 4.40–5.90)
RDW: 12.4 % (ref 11.5–14.5)
WBC: 7.3 10*3/uL (ref 3.6–10.7)

## 2019-07-10 LAB — BASIC METABOLIC PANEL
Anion Gap: 9 NA
BUN: 9 mg/dL (ref 7–20)
CO2: 28 mmol/L (ref 22–30)
Calcium: 9.6 mg/dL (ref 8.4–10.4)
Chloride: 102 mmol/L (ref 98–107)
Creatinine: 0.92 mg/dL (ref 0.52–1.25)
Glucose: 114 mg/dL — ABNORMAL HIGH (ref 70–100)
Potassium: 3.4 mmol/L — ABNORMAL LOW (ref 3.5–5.1)
Sodium: 140 mmol/L (ref 135–145)
eGFR AA: >90 mL/min (ref >60)
eGFR NON-AA: >90 mL/min (ref >60)

## 2019-07-10 LAB — LIPASE: Lipase: 103 U/L (ref 23–300)

## 2019-07-10 LAB — TROPONIN: Troponin I: <0.012 ng/mL (ref 0.000–0.034)

## 2019-07-10 MED ORDER — OMEPRAZOLE 20 MG PO CPDR
20 MG | ORAL_CAPSULE | Freq: Every day | ORAL | 0 refills | Status: AC
Start: 2019-07-10 — End: 2019-08-09

## 2019-07-10 MED ORDER — POTASSIUM CHLORIDE CRYS ER 20 MEQ PO TBCR
20 MEQ | Freq: Once | ORAL | Status: AC
Start: 2019-07-10 — End: 2019-07-10
  Administered 2019-07-10: 21:00:00 20 meq via ORAL

## 2019-07-10 MED ORDER — ASPIRIN 81 MG PO CHEW
81 MG | Freq: Once | ORAL | Status: AC
Start: 2019-07-10 — End: 2019-07-10
  Administered 2019-07-10: 21:00:00 324 mg via ORAL

## 2019-07-10 MED ORDER — ALUM & MAG HYDROXIDE-SIMETH 200-200-20 MG/5ML PO SUSP
200-200-205 MG/5ML | Freq: Once | ORAL | Status: AC
Start: 2019-07-10 — End: 2019-07-10
  Administered 2019-07-10: 21:00:00 via ORAL

## 2019-07-10 MED FILL — ASPIRIN 81 MG PO CHEW: 81 MG | ORAL | Qty: 4

## 2019-07-10 MED FILL — MAG-AL PLUS 200-200-20 MG/5ML PO LIQD: 200-200-20 MG/5ML | ORAL | Qty: 30

## 2019-07-10 MED FILL — KLOR-CON M20 20 MEQ PO TBCR: 20 meq | ORAL | Qty: 1

## 2019-07-10 NOTE — ED Provider Notes (Signed)
[facility] ED  EMERGENCY DEPARTMENT ENCOUNTER      Pt Name: Gary Carson  MRN: [id number]  Birthdate 19-Jun-1981  Date of evaluation: 07/10/2019  Provider: Carleene Overlie, PA-C     ED care was supervised by Dr. Diona Browner who independently examined and evaluated the patient. Please see their attestation note for further details.    CHIEF COMPLAINT       Chief Complaint   Patient presents with   ??? Chest Pain       HISTORY OF PRESENT ILLNESS    I wore a N95 respirator covered by a surgical mask and gloves for the entirety of this encounter.  HPI  Gary Carson is a 38 y.o. male who presents to the emergency department for evaluation of chest pain ??1 day.  Patient has no history significant for hypercholesterolemia, asthma and tobacco use.  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  Pain lasted approximately 5 minutes before resolving.  His chest pain is associated with symptoms of fatigue and some mild shortness of breath increased from his baseline shortness of breath associated with his asthma.  He has experienced subsequent episodes of this pain lasting approximately 2-3 minutes in duration.  He states that over time and throughout the day, his pain has began to ascend from the center of his chest superiorly to the anterior, left side of his chest.  Pain does not radiate to the back or throughout the upper extremities or to the jaw.  He is currently asymptomatic at this time.  He has not appreciated any inciting factors to his pain.  No alleviating factors.  Patient has not attempted the use of any pain medication prior to presentation for symptom relief.  He denies any daily medication use. Denies fever, chills, cough, hemoptysis, palpitations, abdominal pain, nausea, vomiting, diarrhea, headache, dizziness or  syncope.  No new lower extremity swelling, pain.  No history of immobilization or recent hospitalizations.  Denies prior history of myocardial infarction, CVA, PE or deep vein thrombosis.  Denies aspirin or anticoagulant use on a daily basis.  No familial history of cardiovascular disease.  Patient denies any alcohol or illicit drug use including IV drugs.  He has had reflux in the past and was prescribed Pepcid.  He does not feel that this feels similar to prior episodes of reflux.  No sick contact or exposure to any individual testing positive for Covid 19.  He was tested 3 months ago with a negative result.  No additional concerns.    Nursing Notes were reviewed and confirmed as correct.    REVIEW OF SYSTEMS     Review of Systems  This patient's personal and family past medical history as stated in HPI and otherwise negative.  ROS as stated in HPI otherwise negative, a total of 10 systems reviewed.     PAST MEDICAL HISTORY     Past Medical History:   Diagnosis Date   ??? Asthma        SURGICAL HISTORY     Past Surgical History:   Procedure Laterality Date   ??? APPENDECTOMY         CURRENTMEDICATIONS       Discharge Medication List as of 07/10/2019  5:20 PM          ALLERGIES  Penicillins    FAMILY HISTORY     No family history on file.     SOCIAL HISTORY       Social History     Socioeconomic History   ??? Marital status: Married     Spouse name: Not on file   ??? Number of children: Not on file   ??? Years of education: Not on file   ??? Highest education level: Not on file   Occupational History   ??? Not on file   Social Needs   ??? Financial resource strain: Not on file   ??? Food insecurity     Worry: Not on file     Inability: Not on file   ??? Transportation needs     Medical: Not on file     Non-medical: Not on file   Tobacco Use   ??? Smoking status: Current Every Day Smoker     Types: Cigarettes   ??? Smokeless tobacco: Never Used   Substance and Sexual Activity   ??? Alcohol use: No   ??? Drug use: No   ??? Sexual activity: Not  on file   Lifestyle   ??? Physical activity     Days per week: Not on file     Minutes per session: Not on file   ??? Stress: Not on file   Relationships   ??? Social Wellsite geologist on phone: Not on file     Gets together: Not on file     Attends religious service: Not on file     Active member of club or organization: Not on file     Attends meetings of clubs or organizations: Not on file     Relationship status: Not on file   ??? Intimate partner violence     Fear of current or ex partner: Not on file     Emotionally abused: Not on file     Physically abused: Not on file     Forced sexual activity: Not on file   Other Topics Concern   ??? Not on file   Social History Narrative   ??? Not on file       SCREENINGS    Glasgow Coma Scale  Eye Opening: Spontaneous  Best Verbal Response: Oriented  Best Motor Response: Obeys commands  Glasgow Coma Scale Score: 15 Heart Score for chest pain patients  History: Slightly Suspicious  ECG: Normal  Patient Age: < 45 years  *Risk factors for Atherosclerotic disease: Cigarette smoking, Hypercholesterolemia  Risk Factors: 1 or 2 risk factors  Troponin: < 1X normal limit  Heart Score Total: 1    PHYSICAL EXAM       ED Triage Vitals [07/10/19 1500]   BP Temp Temp src Pulse Resp SpO2 Height Weight   (!) 145/84 98 ??F (36.7 ??C) -- 86 18 99 %  (1.727 m) 190 lb (86.2 kg)       Physical Exam  Constitutional: Patient is AAO x3. Resting comfortably in bed, no acute distress, non-toxic in appearance. Vitals as stated above.  HEENT: Head appears atraumatic and normocephalic. Neck is supple. Mucosa is moist and pink.  Uvula and trachea are midline.  No oropharyngeal edema. Tolerating secretions without difficulty. No stridor, trismus, or drooling. No meningismus.   Eyes: Conjunctivae are clear. Full extraocular movements intact. PERRL.   Cardiac: Regular rhythm and rate, S1-S2 are both audible. No murmurs rubs or gallops.   Respiratory: Lungs clear to auscultation in all  fields.  No tachypnea.   Patient speaks in full sentences. No accessory muscle use.  GI: Abdomen is soft, non-distended, non-tender. No rebound tenderness, rigidity, or guarding. Patient has positive bowel sounds in all areas.   GU: No CVA tenderness or suprapubic pain.  Musculoskeletal: No bony tenderness. Neurovascularly intact. Patient ambulates without difficulty.  Extremities: Skin is warm and dry. No soft tissue swelling or edema. No palpable calf cords or tenderness.  Negative Homans sign bilaterally.  Lymphatics: No lymphadenopathy.    Vascular: Good ulnar and radial pulses bilaterally. Good dorsal pedis and posterior tibial pulses bilaterally. Equal pulses bilaterally. Capillary refill is less than 2 seconds.  Integumentary: Skin intact, no erythema, no ecchymosis, no soft tissue swelling.  Neuro: Patient has equal sensation bilaterally, cranial nerves II through XII are grossly intact, cerebella function is normal, no gross sensory or motor deficit.  Psych: Appropriate mood and affect for chief complaint.     LABS:  Labs Reviewed   BASIC METABOLIC PANEL - Abnormal; Notable for the following components:       Result Value    Potassium 3.4 (*)     Glucose 114 (*)     All other components within normal limits    Narrative:     Test Performed by [facility], [address],  [place], [place] [address]   CBC WITH AUTO DIFFERENTIAL    Narrative:     Test Performed by [facility], [address],  [place], [place] [address]   TROPONIN    Narrative:     Test Performed by [facility], [address],  [place], [place] [address]   HEPATIC FUNCTION PANEL    Narrative:     Test Performed by [facility], [address]  [place], [place] [address]   LIPASE    Narrative:     Test Performed by [facility], [address],  [place], [place] [address]   COVID-19       Radiographs:  Xr Chest Portable    Result Date: 07/10/2019  Patient Name:                TIYON, SANOR MRN:                         [id number] Acct#:                        [id number] Diagnostic Radiology ACCESSION                 EXAM [place] [phone]             07/10/2019 15:40 EST     CR Chest Portable         [id number] -Wrenn Willcox CPT code [phone] Reason For Exam (CR Chest Portable) chest pain Report CLINICAL INFORMATION:  Chest pain. A frontal view of the chest was obtained.  No old examinations were available for comparison.  No acute pulmonary disease is noted.  The cardiovascular silhouette is within normal limits. IMPRESSION: No acute pulmonary disease. Report Dictated on Workstation: [id number] ---** Final ---** Dictating Physician: Kary Kos Signed Date and Time: 07/10/2019 3:42 pm Signed by: Kary Kos Transcribed Date and Time: 07/10/2019 3:43  EKG: All EKG's are interpreted by the Emergency Department Physician in the absence of a cardiologist.  Please see their note for interpretation of EKG.    All other labs were within normal range or not returned as of this dictation.    EMERGENCY DEPARTMENT COURSE and DIFFERENTIAL DIAGNOSIS/MDM:   Vitals:    Vitals:    07/10/19 1531 07/10/19 1601 07/10/19 1701 07/10/19 1731   BP: 120/80 131/85 114/70 111/83   Pulse: 73 75 75 81   Resp:       Temp:       SpO2: 98% 97% 97% 99%   Weight:       Height:           Medications   aspirin chewable tablet 324 mg (324 mg Oral Given 07/10/19 1617)   aluminum & magnesium hydroxide-simethicone (MAALOX) 30 mL, lidocaine viscous hcl (XYLOCAINE) 5 mL (GI COCKTAIL) ( Oral Given 07/10/19 1622)   potassium chloride (KLOR-CON M) extended release tablet 20 mEq (20 mEq Oral Given 07/10/19 1622)       MDM  Number of Diagnoses or Management Options  Chest pain, unspecified type: new and requires workup  Dyspnea, unspecified type: new and requires workup  Encounter for screening laboratory testing for COVID-19 virus: new and requires workup  Fatigue, unspecified type: new and requires workup      Amount and/or Complexity of Data Reviewed  Clinical lab tests: ordered and reviewed  Tests in the radiology section of CPT??: ordered and reviewed  Review and summarize past medical records: yes  Discuss the patient with other providers: yes  Independent visualization of images, tracings, or specimens: yes    Risk of Complications, Morbidity, and/or Mortality  Presenting problems: moderate  Diagnostic procedures: moderate  Management options: low    Patient Progress  Patient progress: stable    In short, Gary Carson is a 38 y.o. male who presented to the emergency department for evaluation of chest pain ??1 day.  No significant or contributory patient past medical history. Remaining details of patient presentation and history as documented in the HPI.  I reviewed medical records showing no recent contributory ED visits in regards to today's complaint.  On exam, patient vital signs are stable.  He is nontoxic appearing.  No focal neurological deficit.  Low risk for cardiovascular disease.  Patient has a cardiac heart score of 1.  Perc negative for PE.  GCS 15.    Patient provided with aspirin given his presenting symptoms of chest pain.  Chest pain workup initiated and reviewed. ECG interpreted by my supervising physician, please see their note for further details.  Preliminary report reveals sinus rhythm at a rate of 78 bpm without evidence of acute ischemia. CXR obtained which was interpreted by the radiologist and reviewed by myself. Reveals no evidence of acute cardiopulmonary process.  Laboratory evaluation reveals hypokalemia with a potassium of 3.4.  Patient provided with supplemental potassium replenishment.  Troponin negative.  Complete blood count, remaining BMP, hepatic function panel and lipase are all essentially unremarkable and within normal limits. At this time, the information and evidence obtained from the HPI and physical examination does not warrant any other testing including laboratory or  imaging.    Patient reevaluated following a 3 hour observation while here in the emergency department.  Patient states that he did experience a mild episode of chest pain again while here in the emergency department.  Repeat electrocardiogram obtained which reveals improved findings from comparison.  Rate of  68 bpm without evidence of acute ischemia.     HEART Score ?3 and 1 negative troponin - No hospitalization indicated. I have discussed with the patient my clinical impression and the result of the HEART Score to screen for MACE, as well as the risks of further testing and hospitalization. The HEART Score shows that the risk for MACE is less than 2%. Although the risk of MACE has not Carson eliminated, the risks of further testing or hospitalization for MACE likely exceed the benefit, and the patient declines further emergent evaluation or hospitalization for MACE.    I estimate there is LOW risk for ACUTE CORONARY SYNDROME, PULMONARY EMBOLISM, PNEUMOTHORAX, RUPTURED ESOPHAGUS OR THORACIC AORTIC DISSECTION, thus I consider the discharge disposition reasonable. Gary Olive (or their surrogate) and I have discussed the diagnosis and risks, and we agree with discharging home with close follow-up. We also discussed returning to the Emergency Department immediately if new or worsening symptoms occur. We have discussed the symptoms which are most concerning that necessitate immediate return.  Provided with discharge instructions and prescription for Prilosec.  Patient to follow up with his PCP for reevaluation within 24-48 hours.  He is understanding and agreeable to treatment disposition.  All questions addressed to his satisfaction.  Discharged from the emergency department stable condition.    PROCEDURES:  Unless otherwise noted below, none  Procedures    FINAL IMPRESSION      1. Chest pain, unspecified type    2. Fatigue, unspecified type    3. Encounter for screening laboratory testing for COVID-19 virus    4.  Dyspnea, unspecified type        DISPOSITION/PLAN   DISPOSITION Decision To Discharge 07/10/2019 05:14:45 PM    PATIENT REFERRED TO:  [facility] ED  [address] [place] [address]  [phone]  Go to   As needed, If symptoms worsen    Nestor Lewandowsky, MD  [address]  [address]  [place] [address]  [phone]    Schedule an appointment as soon as possible for a visit   To follow up with your emergency department visit      DISCHARGE MEDICATIONS:  Discharge Medication List as of 07/10/2019  5:20 PM      START taking these medications    Details   omeprazole (PRILOSEC) 20 MG delayed release capsule Take 2 capsules by mouth Daily, Disp-60 capsule, R-0Print            (Please note:  Portions of this note werecompleted with a voice recognition program.  Efforts were made to edit the dictations but occasionally words and phrases are mis-transcribed.)  Form v2016.J.[id number]    Carleene Overlie, PA-C (electronically signed)  Emergency Provider  [facility]        [place], [place]  07/11/19 0530

## 2019-07-10 NOTE — Discharge Instructions (Addendum)
You were seen in the Emergency Department today for chest pain. Thank you for trusting [place] with your care. You had labs, medication administration and x-ray scans done today.     Please follow up with your Primary Care provider within the next 1-2 days. Please call tomorrow to schedule an appointment.  If you cannot maintain close follow-up with your primary care provider, please return to the emergency department for any urgent issues.     Please take all home medications as directed. Continue the use of over-the-counter acetaminophen/Tylenol as directed and as needed for pain relief. Maintain adequate fluid intake and rest.     Please return to the Emergency Department if you have any worsening of your symptoms, develop any new symptoms or would like to be re-evaluated.

## 2019-07-10 NOTE — ED Provider Notes (Signed)
Emergency Department Encounter  [facility] ED    Patient: Gary Carson  MRN: [id number]  DOB: February 05, 1981  Date of Evaluation: 07/10/2019  ED Supervising Physician: Judith Blonder Rasheed Welty, MD    I independently examined and evaluated Gary Carson.    I wore a N95 mask, gloves, and goggles for the entirety of this encounter.    In brief, Gary Carson is a 37 y.o. male that presents to the emergency department for evaluation of chest pain with aching sensation, LEFT side of the chest little bit into the arm. This is been intermittent throughout the day today. Patient states that after first one away. He felt very tired and fatigued. Patient is felt that way. The remainder of the day.    Focused exam:   General appearance: Well-appearing, no acute distress.  Psych: Awake alert and oriented 3. Pleasant and cooperative.  Skin: Warm and dry.  Neck: Supple.  Cardiovascular: Regular rate and rhythm, normal S1-S2 no murmurs rubs or gallops.  Lungs: Clear to auscultation bilaterally, no accessory muscle use, tachypnea, or retractions.  Abdomen: Soft, nontender, and nondistended, no rebound, rigidity, or guarding, positive bowel sounds 4 quadrants.  Extremities:  Warm and well perfused.  NROM and SILT throughout upper and lower extermities.. No edema clubbing, cyanosis or calf tenderness    EKC normal sinus rhythm, rate of 78, PR 164, QRS 110, QTC 415    Brief ED course/MDM: Patient evaluated with blood work, cardiac enzymes and a heart score.    All diagnostic, treatment, and disposition decisions were made by myself in conjunction with the APP/Resident. I also supervised key portions of any procedures performed by the APP/Resident. For all further details of the patient's emergency department visit, please see their documentation.    (Please note that portions of this note may have been completed with a voice recognition program. Efforts were made to edit the dictations but occasionally words are  mis-transcribed.)    Tanny Harnack D Quina Wilbourne, MD  [facility]     Merleen Milliner, MD  07/10/19 [phone]

## 2019-07-10 NOTE — ED Notes (Signed)
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 Since then he states the pain has moved more to the left and higher on the chest and radiates into his arm. He denies any SOB or sweating. Skin is pink warm and dry. Pulses x 4 no edema noted.     Ceasar Mons, RN  07/10/19 [phone]

## 2019-07-10 NOTE — ED Triage Notes (Signed)
Chest pain radaiting down left arm since this am

## 2019-07-12 LAB — COVID-19: SARS-CoV-2: NOT DETECTED
